# Patient Record
Sex: Male | Born: 1980 | Hispanic: Yes | Marital: Married | Smoking: Current every day smoker
Health system: Southern US, Community
[De-identification: ages and names within clinical notes are randomized; demographics above are authoritative.]

## PROBLEM LIST (undated history)

## (undated) DIAGNOSIS — F419 Anxiety disorder, unspecified: Secondary | ICD-10-CM

## (undated) HISTORY — PX: ABDOMINAL SURGERY: SHX537

---

## 2015-12-18 ENCOUNTER — Emergency Department (HOSPITAL_BASED_OUTPATIENT_CLINIC_OR_DEPARTMENT_OTHER): Payer: Self-pay

## 2015-12-18 ENCOUNTER — Emergency Department (HOSPITAL_BASED_OUTPATIENT_CLINIC_OR_DEPARTMENT_OTHER)
Admission: EM | Admit: 2015-12-18 | Discharge: 2015-12-18 | Disposition: A | Discharge status: Home / Self Care | Payer: Self-pay | Attending: Emergency Medicine | Admitting: Emergency Medicine

## 2015-12-18 ENCOUNTER — Encounter (HOSPITAL_BASED_OUTPATIENT_CLINIC_OR_DEPARTMENT_OTHER): Payer: Self-pay | Admitting: Emergency Medicine

## 2015-12-18 DIAGNOSIS — S01511A Laceration without foreign body of lip, initial encounter: Secondary | ICD-10-CM | POA: Insufficient documentation

## 2015-12-18 DIAGNOSIS — Y92410 Unspecified street and highway as the place of occurrence of the external cause: Secondary | ICD-10-CM | POA: Insufficient documentation

## 2015-12-18 DIAGNOSIS — S0993XA Unspecified injury of face, initial encounter: Secondary | ICD-10-CM | POA: Insufficient documentation

## 2015-12-18 DIAGNOSIS — S8002XA Contusion of left knee, initial encounter: Secondary | ICD-10-CM | POA: Insufficient documentation

## 2015-12-18 DIAGNOSIS — Y9389 Activity, other specified: Secondary | ICD-10-CM | POA: Insufficient documentation

## 2015-12-18 DIAGNOSIS — Z791 Long term (current) use of non-steroidal anti-inflammatories (NSAID): Secondary | ICD-10-CM | POA: Insufficient documentation

## 2015-12-18 DIAGNOSIS — Y999 Unspecified external cause status: Secondary | ICD-10-CM | POA: Insufficient documentation

## 2015-12-18 HISTORY — DX: Anxiety disorder, unspecified: F41.9

## 2015-12-18 MED ORDER — IBUPROFEN 800 MG PO TABS: 800.0000 mg | ORAL_TABLET | Freq: Three times a day (TID) | ORAL | 0 refills | Status: AC

## 2015-12-18 MED ORDER — IBUPROFEN 800 MG PO TABS
800.0000 mg | ORAL_TABLET | Freq: Once | ORAL | Status: AC
  Administered 2015-12-18: 800 mg via ORAL
  Filled 2015-12-18: qty 1

## 2015-12-18 MED ORDER — LIDOCAINE HCL (PF) 1 % IJ SOLN
2.0000 mL | Freq: Once | INTRAMUSCULAR | Status: AC
  Administered 2015-12-18: 2 mL
  Filled 2015-12-18: qty 5
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
